# Patient Record
Sex: Female | Born: 1995 | Race: White | Hispanic: No | Marital: Single | Smoking: Never smoker
Health system: Southern US, Community
[De-identification: ages and names within clinical notes are randomized; demographics above are authoritative.]

## PROBLEM LIST (undated history)

## (undated) DIAGNOSIS — C9200 Acute myeloblastic leukemia, not having achieved remission: Secondary | ICD-10-CM

## (undated) DIAGNOSIS — J45909 Unspecified asthma, uncomplicated: Secondary | ICD-10-CM

## (undated) HISTORY — PX: BONE MARROW TRANSPLANT: SHX200

---

## 2018-06-19 ENCOUNTER — Emergency Department: Payer: Commercial Managed Care - PPO

## 2018-06-19 ENCOUNTER — Other Ambulatory Visit: Payer: Self-pay

## 2018-06-19 ENCOUNTER — Emergency Department
Admission: EM | Admit: 2018-06-19 | Discharge: 2018-06-19 | Disposition: A | Discharge status: Home / Self Care | Payer: Commercial Managed Care - PPO | Attending: Emergency Medicine | Admitting: Emergency Medicine

## 2018-06-19 ENCOUNTER — Encounter: Payer: Self-pay | Admitting: Emergency Medicine

## 2018-06-19 DIAGNOSIS — Y9289 Other specified places as the place of occurrence of the external cause: Secondary | ICD-10-CM | POA: Insufficient documentation

## 2018-06-19 DIAGNOSIS — W010XXA Fall on same level from slipping, tripping and stumbling without subsequent striking against object, initial encounter: Secondary | ICD-10-CM | POA: Diagnosis not present

## 2018-06-19 DIAGNOSIS — Y9389 Activity, other specified: Secondary | ICD-10-CM | POA: Insufficient documentation

## 2018-06-19 DIAGNOSIS — S99912A Unspecified injury of left ankle, initial encounter: Secondary | ICD-10-CM | POA: Diagnosis present

## 2018-06-19 DIAGNOSIS — Y998 Other external cause status: Secondary | ICD-10-CM | POA: Diagnosis not present

## 2018-06-19 DIAGNOSIS — S93402A Sprain of unspecified ligament of left ankle, initial encounter: Secondary | ICD-10-CM | POA: Diagnosis not present

## 2018-06-19 HISTORY — DX: Acute myeloblastic leukemia, not having achieved remission: C92.00

## 2018-06-19 MED ORDER — IBUPROFEN 600 MG PO TABS: 600.0000 mg | ORAL_TABLET | Freq: Three times a day (TID) | ORAL | 0 refills | Status: AC | PRN

## 2018-06-19 MED ORDER — IBUPROFEN 600 MG PO TABS
600.0000 mg | ORAL_TABLET | Freq: Once | ORAL | Status: AC
  Administered 2018-06-19: 600 mg via ORAL
  Filled 2018-06-19: qty 1

## 2018-06-19 MED ORDER — TRAMADOL HCL 50 MG PO TABS
50.0000 mg | ORAL_TABLET | Freq: Once | ORAL | Status: AC
  Administered 2018-06-19: 50 mg via ORAL
  Filled 2018-06-19: qty 1

## 2018-06-19 MED ORDER — TRAMADOL HCL 50 MG PO TABS: 50.0000 mg | ORAL_TABLET | Freq: Two times a day (BID) | ORAL | 0 refills | Status: DC | PRN

## 2018-06-19 NOTE — ED Provider Notes (Signed)
[facility] Emergency Department Provider Note   ____________________________________________   First MD Initiated Contact with Patient 06/19/18 [phone]     (approximate)  I have reviewed the triage vital signs and the nursing notes.   HISTORY  Chief Complaint Ankle Pain    HPI Patricia Cardenas is a 23 y.o. female patient presents with left ankle pain secondary to a fall last night.  Patient is a pain and edema is preventing her from weightbearing.  Patient rates pain as 7/10.  Patient described pain is "achy".  No palliative measure for complaint.    Past Medical History:  Diagnosis Date  . 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   Past Surgical History:  Procedure Laterality Date  . BONE MARROW TRANSPLANT      Prior to Admission medications   Medication Sig Start Date End Date Taking? Authorizing Provider  cholecalciferol (VITAMIN D3) 25 MCG (1000 UT) tablet Take 1,000 Units by mouth 2 (two) times daily.   Yes [provider]  montelukast (SINGULAIR) 10 MG tablet Take 10 mg by mouth at bedtime.   Yes [provider]  ibuprofen (ADVIL,MOTRIN) 600 MG tablet Take 1 tablet (600 mg total) by mouth every 8 (eight) hours as needed. 06/19/18   Sable Feil, PA-C  traMADol (ULTRAM) 50 MG tablet Take 1 tablet (50 mg total) by mouth every 12 (twelve) hours as needed. 06/19/18   Sable Feil, PA-C    Allergies Methotrexate  No family history on file.  Social History Social History   Tobacco Use  . Smoking status: Never Smoker  . Smokeless tobacco: Never Used  Substance Use Topics  . Alcohol use: Yes    Frequency: Never    Comment: occasional  . Drug use: Never    Review of Systems Constitutional: No fever/chills Eyes: No visual changes. ENT: No sore throat. Cardiovascular: Denies chest pain. Respiratory: Denies shortness of breath. Gastrointestinal: No abdominal  pain.  No nausea, no vomiting.  No diarrhea.  No constipation. Genitourinary: Negative for dysuria. Musculoskeletal: Left lateral ankle pain.   Skin: Negative for rash. Neurological: Negative for headaches, focal weakness or numbness. Allergic/Immunilogical: Methotrexate ____________________________________________   PHYSICAL EXAM:  VITAL SIGNS: ED Triage Vitals [06/19/18 0736]  Enc Vitals Group     BP 115/78     Pulse Rate (!) 108     Resp 16     Temp 98.2 F (36.8 C)     Temp Source Oral     SpO2 96 %     Weight 130 lb (59 kg)     Height 5\' 3"  (1.6 m)     Head Circumference      Peak Flow      Pain Score 7     Pain Loc      Pain Edu?      Excl. in [place]?     Constitutional: Alert and oriented. Well appearing and in no acute distress. Cardiovascular: Normal rate, regular rhythm. Grossly normal heart sounds.  Good peripheral circulation. Respiratory: Normal respiratory effort.  No retractions. Lungs CTAB. Musculoskeletal: No obvious deformity to left ankle.  Moderate lateral edema.   Neurologic:  Normal speech and language. No gross focal neurologic deficits are appreciated. No gait instability. Skin:  Skin is warm, dry and intact. No rash noted. Psychiatric: Mood and affect are normal. Speech and behavior are normal.  ____________________________________________   LABS (all labs ordered  are listed, but only abnormal results are displayed)  Labs Reviewed - No data to display ____________________________________________  EKG   ____________________________________________  RADIOLOGY  ED MD interpretation:    Official radiology report(s): No results found.  ____________________________________________   PROCEDURES  Procedure(s) performed: None  .Splint Application Date/Time: 05/20/1094 8:16 AM Performed by: Brayton Caves, NT Authorized by: Sable Feil, PA-C   Consent:    Consent obtained:  Verbal   Consent given by:  Patient   Risks discussed:   Numbness, pain and swelling Pre-procedure details:    Sensation:  Normal Procedure details:    Laterality:  Left   Location:  Ankle   Ankle:  L ankle   Strapping: no     Splint type:  Short leg   Supplies:  Ortho-Glass Post-procedure details:    Pain:  Unchanged   Sensation:  Normal   Patient tolerance of procedure:  Tolerated well, no immediate complications    Critical Care performed: No  ____________________________________________   INITIAL IMPRESSION / ASSESSMENT AND PLAN / ED COURSE  As part of my medical decision making, I reviewed the following data within the electronic MEDICAL RECORD NUMBER     Left lateral ankle pain and edema secondary to sprain.  Patient placed in a OCL splint and given crutches to support ambulation.  Follow discharge care instructions.  Patient given a school note.  Patient vies take medication as directed.  Patient advised follow-up PCP if no improvement in 3 to 5 days.      ____________________________________________   FINAL CLINICAL IMPRESSION(S) / ED DIAGNOSES  Final diagnoses:  Sprain of left ankle, unspecified ligament, initial encounter     ED Discharge Orders         Ordered    traMADol (ULTRAM) 50 MG tablet  Every 12 hours PRN     06/19/18 0815    ibuprofen (ADVIL,MOTRIN) 600 MG tablet  Every 8 hours PRN     06/19/18 0815           Note:  This document was prepared using Dragon voice recognition software and may include unintentional dictation errors.    Sable Feil, PA-C 06/19/18 0454    Earleen Newport, MD 06/19/18 [phone]

## 2018-06-19 NOTE — ED Triage Notes (Signed)
Patient reports falling last night and hurting left ankle. Ankle swollen. Patient unable to bear weight.

## 2018-06-19 NOTE — Discharge Instructions (Signed)
Wear splint and ambulate with crutches foot 3 to 5 days as needed.

## 2019-02-06 ENCOUNTER — Other Ambulatory Visit: Payer: Self-pay

## 2019-02-06 DIAGNOSIS — Z20822 Contact with and (suspected) exposure to covid-19: Secondary | ICD-10-CM

## 2019-02-07 LAB — NOVEL CORONAVIRUS, NAA: SARS-CoV-2, NAA: NOT DETECTED

## 2019-02-21 ENCOUNTER — Ambulatory Visit: Payer: Commercial Managed Care - PPO | Admitting: Cardiology

## 2019-06-05 ENCOUNTER — Other Ambulatory Visit: Payer: Self-pay

## 2019-06-05 ENCOUNTER — Emergency Department: Payer: Commercial Managed Care - PPO

## 2019-06-05 ENCOUNTER — Emergency Department
Admission: EM | Admit: 2019-06-05 | Discharge: 2019-06-05 | Disposition: A | Discharge status: Home / Self Care | Payer: Commercial Managed Care - PPO | Attending: Emergency Medicine | Admitting: Emergency Medicine

## 2019-06-05 DIAGNOSIS — Y999 Unspecified external cause status: Secondary | ICD-10-CM | POA: Insufficient documentation

## 2019-06-05 DIAGNOSIS — Z79899 Other long term (current) drug therapy: Secondary | ICD-10-CM | POA: Insufficient documentation

## 2019-06-05 DIAGNOSIS — S63502A Unspecified sprain of left wrist, initial encounter: Secondary | ICD-10-CM

## 2019-06-05 DIAGNOSIS — S6992XA Unspecified injury of left wrist, hand and finger(s), initial encounter: Secondary | ICD-10-CM | POA: Diagnosis present

## 2019-06-05 DIAGNOSIS — Y9323 Activity, snow (alpine) (downhill) skiing, snow boarding, sledding, tobogganing and snow tubing: Secondary | ICD-10-CM | POA: Diagnosis not present

## 2019-06-05 DIAGNOSIS — Y92828 Other wilderness area as the place of occurrence of the external cause: Secondary | ICD-10-CM | POA: Diagnosis not present

## 2019-06-05 MED ORDER — OXYCODONE-ACETAMINOPHEN 5-325 MG PO TABS
1.0000 | ORAL_TABLET | Freq: Once | ORAL | Status: AC
  Administered 2019-06-05: 1 via ORAL
  Filled 2019-06-05: qty 1

## 2019-06-05 MED ORDER — MELOXICAM 7.5 MG PO TABS
15.0000 mg | ORAL_TABLET | Freq: Once | ORAL | Status: AC
  Administered 2019-06-05: 21:00:00 15 mg via ORAL
  Filled 2019-06-05: qty 2

## 2019-06-05 MED ORDER — MELOXICAM 15 MG PO TABS: 15.0000 mg | ORAL_TABLET | Freq: Every day | ORAL | 0 refills | Status: AC

## 2019-06-05 NOTE — ED Provider Notes (Signed)
[facility] Emergency Department Provider Note  ____________________________________________  Time seen: Approximately 8:58 PM  I have reviewed the triage vital signs and the nursing notes.   HISTORY  Chief Complaint Wrist Pain    HPI Patricia Cardenas is a 24 y.o. female who presents the emergency department complaining of left wrist pain.  Patient reports that she was snowboarding today, caught the edge of a stone or falling onto an outstretched hand.  Patient is having pain to the wrist region of the left wrist.  No other injury or complaint.  She did not hit her head or lose consciousness.  Patient has her arm in a sling at this time for symptom relief.  No numbness or tingling of the hand.  No history of previous injury to the wrist.         Past Medical History:  Diagnosis Date  . 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   Past Surgical History:  Procedure Laterality Date  . BONE MARROW TRANSPLANT      Prior to Admission medications   Medication Sig Start Date End Date Taking? Authorizing Provider  cholecalciferol (VITAMIN D3) 25 MCG (1000 UT) tablet Take 1,000 Units by mouth 2 (two) times daily.    [provider]  ibuprofen (ADVIL,MOTRIN) 600 MG tablet Take 1 tablet (600 mg total) by mouth every 8 (eight) hours as needed. 06/19/18   Sable Feil, PA-C  meloxicam (MOBIC) 15 MG tablet Take 1 tablet (15 mg total) by mouth daily. 06/05/19   Jacquita Mulhearn, Charline Bills, PA-C  montelukast (SINGULAIR) 10 MG tablet Take 10 mg by mouth at bedtime.    [provider]  traMADol (ULTRAM) 50 MG tablet Take 1 tablet (50 mg total) by mouth every 12 (twelve) hours as needed. 06/19/18   Sable Feil, PA-C    Allergies Methotrexate  No family history on file.  Social History Social History   Tobacco Use  . Smoking status: Never Smoker  . Smokeless tobacco: Never Used  Substance Use Topics   . Alcohol use: Yes    Comment: occasional  . Drug use: Never     Review of Systems  Constitutional: No fever/chills Eyes: No visual changes. No discharge ENT: No upper respiratory complaints. Cardiovascular: no chest pain. Respiratory: no cough. No SOB. Gastrointestinal: No abdominal pain.  No nausea, no vomiting.  Musculoskeletal: Positive for left wrist injury Skin: Negative for rash, abrasions, lacerations, ecchymosis. Neurological: Negative for headaches, focal weakness or numbness. 10-point ROS otherwise negative.  ____________________________________________   PHYSICAL EXAM:  VITAL SIGNS: ED Triage Vitals  Enc Vitals Group     BP 06/05/19 1916 140/85     Pulse Rate 06/05/19 1916 81     Resp 06/05/19 1916 18     Temp 06/05/19 1916 98.5 F (36.9 C)     Temp src --      SpO2 06/05/19 1916 97 %     Weight 06/05/19 1917 137 lb (62.1 kg)     Height 06/05/19 1917 5\' 3"  (1.6 m)     Head Circumference --      Peak Flow --      Pain Score 06/05/19 1916 6     Pain Loc --      Pain Edu? --      Excl. in [place]? --      Constitutional: Alert and oriented. Well appearing and in no acute distress. Eyes: Conjunctivae  are normal. PERRL. EOMI. Head: Atraumatic. ENT:      Ears:       Nose: No congestion/rhinnorhea.      Mouth/Throat: Mucous membranes are moist.  Neck: No stridor.    Cardiovascular: Normal rate, regular rhythm. Normal S1 and S2.  Good peripheral circulation. Respiratory: Normal respiratory effort without tachypnea or retractions. Lungs CTAB. Good air entry to the bases with no decreased or absent breath sounds. Musculoskeletal: Full range of motion to all extremities. No gross deformities appreciated.  Visualization of the left wrist reveals mild edema when compared with right.  No obvious deformity.  No abrasions, lacerations, significant ecchymosis.  Patient has limited range of motion to the wrist but is able to move all 5 digits appropriately.  Patient is  exquisitely tender to palpation over both the distal radius and the ulna.  No palpable abnormality.  Radial pulse intact.  Sensation intact all 5 digits.  Capillary refill less than 2 seconds all digits. Neurologic:  Normal speech and language. No gross focal neurologic deficits are appreciated.  Skin:  Skin is warm, dry and intact. No rash noted. Psychiatric: Mood and affect are normal. Speech and behavior are normal. Patient exhibits appropriate insight and judgement.   ____________________________________________   LABS (all labs ordered are listed, but only abnormal results are displayed)  Labs Reviewed - No data to display ____________________________________________  EKG   ____________________________________________  RADIOLOGY I personally viewed and evaluated these images as part of my medical decision making, as well as reviewing the written report by the radiologist.  DG Wrist Complete Left  Result Date: 06/05/2019 CLINICAL DATA:  Left wrist pain. EXAM: LEFT WRIST - COMPLETE 3+ VIEW COMPARISON:  None. FINDINGS: There is no evidence of fracture or dislocation. There is no evidence of arthropathy or other focal bone abnormality. Soft tissues are unremarkable. IMPRESSION: No definite acute displaced fracture or dislocation. If an occult fracture is suspected, follow-up radiographs are recommended in 10-14 days. Electronically Signed   By: Constance Holster M.D.   On: 06/05/2019 19:39    ____________________________________________    PROCEDURES  Procedure(s) performed:    Procedures    Medications  meloxicam (MOBIC) tablet 15 mg (has no administration in time range)  oxyCODONE-acetaminophen (PERCOCET/ROXICET) 5-325 MG per tablet 1 tablet (has no administration in time range)     ____________________________________________   INITIAL IMPRESSION / ASSESSMENT AND PLAN / ED COURSE  Pertinent labs & imaging results that were available during my care of the  patient were reviewed by me and considered in my medical decision making (see chart for details).  Review of the [place] CSRS was performed in accordance of the [place] prior to dispensing any controlled drugs.           Patient's diagnosis is consistent with left wrist pain.  Patient presents emergency department complaining of left wrist pain after snowboard accident.  Imaging reveals no evidence of fracture.  No evidence of ligament rupture.  Patient will be placed in a Velcro wrist splint.. Patient will be discharged home with prescriptions for meloxicam. Patient is to follow up with orthopedics as needed or otherwise directed. Patient is given ED precautions to return to the ED for any worsening or new symptoms.     ____________________________________________  FINAL CLINICAL IMPRESSION(S) / ED DIAGNOSES  Final diagnoses:  Sprain of left wrist, initial encounter      NEW MEDICATIONS STARTED DURING THIS VISIT:  ED Discharge Orders         Ordered  meloxicam (MOBIC) 15 MG tablet  Daily     06/05/19 2102              This chart was dictated using voice recognition software/Dragon. Despite best efforts to proofread, errors can occur which can change the meaning. Any change was purely unintentional.    Darletta Moll, PA-C 06/05/19 2105    Harvest Dark, MD 06/05/19 2111

## 2019-06-05 NOTE — ED Triage Notes (Signed)
[place]. Patient c/o left wrist pain.

## 2019-07-24 ENCOUNTER — Ambulatory Visit: Payer: Commercial Managed Care - PPO

## 2019-08-04 ENCOUNTER — Ambulatory Visit: Payer: Commercial Managed Care - PPO | Attending: Internal Medicine

## 2019-08-04 ENCOUNTER — Other Ambulatory Visit: Payer: Self-pay

## 2019-08-04 DIAGNOSIS — Z23 Encounter for immunization: Secondary | ICD-10-CM

## 2019-08-04 NOTE — Progress Notes (Signed)
   Covid-19 Vaccination Clinic  Name:  Patricia Cardenas    MRN: [id number] DOB: 08-07-1995  08/04/2019  [name] was observed post Covid-19 immunization for 15 minutes without incident. She was provided with Vaccine Information Sheet and instruction to access the V-Safe system.   [name] was instructed to call 911 with any severe reactions post vaccine: Marland Kitchen Difficulty breathing  . Swelling of face and throat  . A fast heartbeat  . A bad rash all over body  . Dizziness and weakness   Immunizations Administered    Name Date Dose VIS Date Route   Pfizer COVID-19 Vaccine 08/04/2019 11:57 AM 0.3 mL 04/25/2019 Intramuscular   Manufacturer: [employer]   Lot: [id number]   [place]: [id number]

## 2019-09-02 ENCOUNTER — Ambulatory Visit: Payer: Commercial Managed Care - PPO | Attending: Internal Medicine

## 2019-09-02 DIAGNOSIS — Z23 Encounter for immunization: Secondary | ICD-10-CM

## 2019-09-02 NOTE — Progress Notes (Signed)
   Covid-19 Vaccination Clinic  Name:  Magdlene Prak    MRN: [id number] DOB: [birth year]  09/02/2019  Ms. Praska was observed post Covid-19 immunization for 15 minutes without incident. She was provided with Vaccine Information Sheet and instruction to access the V-Safe system.   Ms. Manganiello was instructed to call 911 with any severe reactions post vaccine: Marland Kitchen Difficulty breathing  . Swelling of face and throat  . A fast heartbeat  . A bad rash all over body  . Dizziness and weakness   Immunizations Administered    Name Date Dose VIS Date Route   Pfizer COVID-19 Vaccine 09/02/2019 11:38 AM 0.3 mL 07/09/2018 Intramuscular   Manufacturer: [employer], [employer]   Lot: [id number]   [place]: [id number]

## 2020-01-05 ENCOUNTER — Emergency Department
Admission: EM | Admit: 2020-01-05 | Discharge: 2020-01-05 | Discharge status: Against Medical Advice | Payer: Commercial Managed Care - PPO

## 2020-01-05 NOTE — ED Notes (Signed)
No answer when called several times from lobby 

## 2020-01-05 NOTE — ED Notes (Signed)
No answer when called several times from lobby; no answer when cell phone called

## 2020-01-05 NOTE — ED Notes (Signed)
Out in her car

## 2020-06-21 ENCOUNTER — Emergency Department: Payer: Commercial Managed Care - PPO

## 2020-06-21 ENCOUNTER — Other Ambulatory Visit: Payer: Self-pay

## 2020-06-21 ENCOUNTER — Emergency Department
Admission: EM | Admit: 2020-06-21 | Discharge: 2020-06-21 | Disposition: A | Discharge status: Home / Self Care | Payer: Commercial Managed Care - PPO | Attending: Emergency Medicine | Admitting: Emergency Medicine

## 2020-06-21 DIAGNOSIS — M25422 Effusion, left elbow: Secondary | ICD-10-CM | POA: Diagnosis not present

## 2020-06-21 DIAGNOSIS — W010XXA Fall on same level from slipping, tripping and stumbling without subsequent striking against object, initial encounter: Secondary | ICD-10-CM | POA: Insufficient documentation

## 2020-06-21 DIAGNOSIS — M25522 Pain in left elbow: Secondary | ICD-10-CM | POA: Diagnosis present

## 2020-06-21 MED ORDER — NAPROXEN 500 MG PO TABS: 500.0000 mg | ORAL_TABLET | Freq: Two times a day (BID) | ORAL | 0 refills | Status: AC

## 2020-06-21 MED ORDER — TRAMADOL HCL 50 MG PO TABS: 50.0000 mg | ORAL_TABLET | Freq: Four times a day (QID) | ORAL | 0 refills | Status: AC | PRN

## 2020-06-21 NOTE — ED Triage Notes (Signed)
Pt comes with c/o left arm injury falling a slip and fall this am. Pt states pain to left arm and shoulder. Pt states some back and butt pain as well. Pt states pain at elbow as well  Pt able to move fingers and pain to move arm.

## 2020-06-21 NOTE — ED Notes (Signed)
See triage note  Presents s/p slip and fall    Landed on left elbow and lower back

## 2020-06-21 NOTE — ED Provider Notes (Signed)
[facility] Emergency Department Provider Note ____________________________________________  Time seen: Approximately 2:12 PM  I have reviewed the triage vital signs and the nursing notes.   HISTORY  Chief Complaint Arm Injury    HPI Patricia Cardenas is a 25 y.o. female who presents to the emergency department for evaluation and treatment of left elbow pain after a mechanical, non-syncopal fall this morning. As she was walking out to go to class, her foot slipped and she fell down 5 brick stairs. She caught herself with her elbow and slid down the rest of the way on her back.   Past Medical History:  Diagnosis Date  . 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   Past Surgical History:  Procedure Laterality Date  . BONE MARROW TRANSPLANT      Prior to Admission medications   Medication Sig Start Date End Date Taking? Authorizing Provider  naproxen (NAPROSYN) 500 MG tablet Take 1 tablet (500 mg total) by mouth 2 (two) times daily with a meal. 06/21/20  Yes Tina Temme B, FNP  traMADol (ULTRAM) 50 MG tablet Take 1 tablet (50 mg total) by mouth every 6 (six) hours as needed. 06/21/20  Yes Yadira Hada B, FNP  cholecalciferol (VITAMIN D3) 25 MCG (1000 UT) tablet Take 1,000 Units by mouth 2 (two) times daily.    [provider]  ibuprofen (ADVIL,MOTRIN) 600 MG tablet Take 1 tablet (600 mg total) by mouth every 8 (eight) hours as needed. 06/19/18   Sable Feil, PA-C  meloxicam (MOBIC) 15 MG tablet Take 1 tablet (15 mg total) by mouth daily. 06/05/19   Cuthriell, Charline Bills, PA-C  montelukast (SINGULAIR) 10 MG tablet Take 10 mg by mouth at bedtime.    [provider]    Allergies Methotrexate  No family history on file.  Social History Social History   Tobacco Use  . Smoking status: Never Smoker  . Smokeless tobacco: Never Used  Vaping Use  . Vaping Use: Never used  Substance Use  Topics  . Alcohol use: Yes    Comment: occasional  . Drug use: Never    Review of Systems Constitutional: Negative for fever. Cardiovascular: Negative for chest pain. Respiratory: Negative for shortness of breath. Musculoskeletal: Positive for left upper extremity pain Skin: Negative for open wounds or lesions Neurological: Negative for decrease in sensation  ____________________________________________   PHYSICAL EXAM:  VITAL SIGNS: ED Triage Vitals  Enc Vitals Group     BP 06/21/20 1204 (!) 138/99     Pulse Rate 06/21/20 1204 81     Resp 06/21/20 1204 18     Temp 06/21/20 1204 98.2 F (36.8 C)     Temp Source 06/21/20 1204 Oral     SpO2 06/21/20 1206 100 %     Weight 06/21/20 1321 136 lb 14.5 oz (62.1 kg)     Height 06/21/20 1321 5\' 3"  (1.6 m)     Head Circumference --      Peak Flow --      Pain Score 06/21/20 1204 10     Pain Loc --      Pain Edu? --      Excl. in [place]? --     Constitutional: Alert and oriented. Well appearing and in no acute distress. Eyes: Conjunctivae are clear without discharge or drainage Head: Atraumatic Neck: Supple.  No focal midline tenderness Respiratory: No cough. Respirations are even and unlabored. Musculoskeletal: Unable  to fully extend left elbow.  Full range of motion of the wrist.  No tenderness over the upper part of the left forearm or shoulder. Neurologic: Awake, alert, oriented Skin: No open wound Psychiatric: Affect and behavior are appropriate.  ____________________________________________   LABS (all labs ordered are listed, but only abnormal results are displayed)  Labs Reviewed - No data to display ____________________________________________  RADIOLOGY  Elbow joint effusion without definite acute fracture but suspicious for occult fracture.  I, Sherrie George, personally viewed and evaluated these images (plain radiographs) as part of my medical decision making, as well as reviewing the written report by the  radiologist.  No results found. ____________________________________________   PROCEDURES  .Ortho Injury Treatment  Date/Time: 06/22/2020 4:34 PM Performed by: Victorino Dike, FNP Authorized by: Victorino Dike, FNP   Consent:    Consent obtained:  Verbal   Consent given by:  Patient   Risks discussed:  Restricted joint movement and stiffnessInjury location: elbow Location details: left elbow Injury type: soft tissue Pre-procedure neurovascular assessment: neurovascularly intact Pre-procedure distal perfusion: normal Pre-procedure neurological function: normal Pre-procedure range of motion: reduced Immobilization: splint Splint type: long arm Splint Applied by: ED Nurse Supplies used: cotton padding,  elastic bandage and Ortho-Glass Post-procedure neurovascular assessment: post-procedure neurovascularly intact Post-procedure distal perfusion: normal Post-procedure neurological function: normal Post-procedure range of motion: unchanged     ____________________________________________   INITIAL IMPRESSION / [place] / ED COURSE  Patricia Cardenas is a 25 y.o. who presents to the emergency department for treatment and evaluation of left arm pain after falling on the ice. See HPI for details.  X-ray shows a joint effusion and likely occult fracture. Fracture would be most consistent with radial neck fracture or similar injury. She was placed in a long arm posterior OCL and is to follow up with orthopedics for repeat imaging.  Medications - No data to display  Pertinent labs & imaging results that were available during my care of the patient were reviewed by me and considered in my medical decision making (see chart for details).   _________________________________________   FINAL CLINICAL IMPRESSION(S) / ED DIAGNOSES  Final diagnoses:  Joint effusion of elbow, left    ED Discharge Orders         Ordered    traMADol (ULTRAM) 50 MG tablet  Every 6 hours  PRN        06/21/20 1550    naproxen (NAPROSYN) 500 MG tablet  2 times daily with meals        06/21/20 1550           If controlled substance prescribed during this visit, 12 month history viewed on the [place] prior to issuing an initial prescription for Schedule II or III opiod.   Victorino Dike, FNP 06/22/20 1646    Vladimir Crofts, MD 06/22/20 [place]

## 2020-06-21 NOTE — Discharge Instructions (Signed)
Leave the splint in place until you can follow up with orthopedics.  Return to the ER for symptoms that change or worsen or for new concerns if unable to see orthopedics.

## 2020-07-18 ENCOUNTER — Emergency Department
Admission: EM | Admit: 2020-07-18 | Discharge: 2020-07-18 | Disposition: A | Discharge status: Home / Self Care | Payer: Commercial Managed Care - PPO | Attending: Emergency Medicine | Admitting: Emergency Medicine

## 2020-07-18 ENCOUNTER — Other Ambulatory Visit: Payer: Self-pay

## 2020-07-18 ENCOUNTER — Encounter: Payer: Self-pay | Admitting: Emergency Medicine

## 2020-07-18 ENCOUNTER — Emergency Department: Payer: Commercial Managed Care - PPO

## 2020-07-18 DIAGNOSIS — Y9289 Other specified places as the place of occurrence of the external cause: Secondary | ICD-10-CM | POA: Diagnosis not present

## 2020-07-18 DIAGNOSIS — J45909 Unspecified asthma, uncomplicated: Secondary | ICD-10-CM | POA: Diagnosis not present

## 2020-07-18 DIAGNOSIS — W1809XA Striking against other object with subsequent fall, initial encounter: Secondary | ICD-10-CM | POA: Diagnosis not present

## 2020-07-18 DIAGNOSIS — S0990XA Unspecified injury of head, initial encounter: Secondary | ICD-10-CM | POA: Diagnosis present

## 2020-07-18 DIAGNOSIS — M542 Cervicalgia: Secondary | ICD-10-CM | POA: Diagnosis not present

## 2020-07-18 DIAGNOSIS — Y9389 Activity, other specified: Secondary | ICD-10-CM | POA: Diagnosis not present

## 2020-07-18 HISTORY — DX: Unspecified asthma, uncomplicated: J45.909

## 2020-07-18 MED ORDER — ONDANSETRON 4 MG PO TBDP: 4.0000 mg | ORAL_TABLET | Freq: Three times a day (TID) | ORAL | 0 refills | Status: AC | PRN

## 2020-07-18 NOTE — Discharge Instructions (Signed)
Follow up with your regular doctor if not improving 3 to 4 days Return to the ER if worsening Take the zofran as needed for nausea Tylenol or ibuprofen for pain as needed Apply ice to the area that hurts

## 2020-07-18 NOTE — ED Triage Notes (Signed)
Pt via POV from home. Pt states that she fell off of the stool and hit the back of head on the floor. Denies blood thinners. Denies LOC. Pt c/o L sided head pain that radiates to her neck. Pt's fall was around 9:00pm last night. Pt took Tylenol this AM around 9:00pm with no relief. Pt is A&Ox4 and NAD.

## 2020-07-18 NOTE — ED Provider Notes (Signed)
[facility] Emergency Department Provider Note  ____________________________________________   Event Date/Time   First MD Initiated Contact with Patient 07/18/20 1613     (approximate)  I have reviewed the triage vital signs and the nursing notes.   HISTORY  Chief Complaint Fall    HPI Patricia Cardenas is a 25 y.o. female presents emergency department complaining of headache and neck pain.  Patient states that she was standing on a bench cleaning last night when she fell and hit the right side of her head along with her ear.  No LOC at the time.  Patient's had a headache and nausea since she fell.  No other injuries reported.    Past Medical History:  Diagnosis Date  . 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 Asthma     There are no problems to display for this patient.   Past Surgical History:  Procedure Laterality Date  . BONE MARROW TRANSPLANT      Prior to Admission medications   Medication Sig Start Date End Date Taking? Authorizing Provider  ondansetron (ZOFRAN-ODT) 4 MG disintegrating tablet Take 1 tablet (4 mg total) by mouth every 8 (eight) hours as needed. 07/18/20  Yes Emmarose Klinke, Linden Dolin, PA-C  cholecalciferol (VITAMIN D3) 25 MCG (1000 UT) tablet Take 1,000 Units by mouth 2 (two) times daily.    [provider]  ibuprofen (ADVIL,MOTRIN) 600 MG tablet Take 1 tablet (600 mg total) by mouth every 8 (eight) hours as needed. 06/19/18   Sable Feil, PA-C  meloxicam (MOBIC) 15 MG tablet Take 1 tablet (15 mg total) by mouth daily. 06/05/19   Cuthriell, Charline Bills, PA-C  montelukast (SINGULAIR) 10 MG tablet Take 10 mg by mouth at bedtime.    [provider]  naproxen (NAPROSYN) 500 MG tablet Take 1 tablet (500 mg total) by mouth 2 (two) times daily with a meal. 06/21/20   Triplett, Cari B, FNP  traMADol (ULTRAM) 50 MG tablet Take 1 tablet (50 mg total) by mouth every 6 (six) hours as needed. 06/21/20   Sherrie George B, FNP     Allergies Methotrexate  History reviewed. No pertinent family history.  Social History Social History   Tobacco Use  . Smoking status: Never Smoker  . Smokeless tobacco: Never Used  Vaping Use  . Vaping Use: Never used  Substance Use Topics  . Alcohol use: Yes    Comment: occasional  . Drug use: Never    Review of Systems  Constitutional: No fever/chills Eyes: No visual changes. ENT: No sore throat. Respiratory: Denies cough Cardiovascular: Denies chest pain Gastrointestinal: Denies abdominal pain Genitourinary: Negative for dysuria. Musculoskeletal: Negative for back pain. Skin: Negative for rash. Psychiatric: no mood changes,     ____________________________________________   PHYSICAL EXAM:  VITAL SIGNS: ED Triage Vitals  Enc Vitals Group     BP 07/18/20 1525 (!) 152/80     Pulse Rate 07/18/20 1525 (!) 58     Resp 07/18/20 1525 16     Temp 07/18/20 1525 97.9 F (36.6 C)     Temp Source 07/18/20 1525 Oral     SpO2 07/18/20 1525 100 %     Weight 07/18/20 1530 140 lb (63.5 kg)     Height 07/18/20 1530 5\' 3"  (1.6 m)     Head Circumference --      Peak Flow --      Pain Score --      Pain Loc --  Pain Edu? --      Excl. in [place]? --     Constitutional: Alert and oriented. Well appearing and in no acute distress. Eyes: Conjunctivae are normal.  Head: Right side of skull is tender, tender along the mastoid area, right ear is tender, C-spine is tender at the upper aspect around C3, no battle signs noted Ear: TMs intact no blood is noted Nose: No congestion/rhinnorhea. Mouth/Throat: Mucous membranes are moist.   Neck:  supple no lymphadenopathy noted Cardiovascular: Normal rate, regular rhythm.  Respiratory: Normal respiratory effort.  No retractions, GU: deferred Musculoskeletal: FROM all extremities, warm and well perfused Neurologic:  Normal speech and language.  Cranial nerves II through XII grossly intact Skin:  Skin is warm, dry and intact.  No rash noted. Psychiatric: Mood and affect are normal. Speech and behavior are normal.  ____________________________________________   LABS (all labs ordered are listed, but only abnormal results are displayed)  Labs Reviewed - No data to display ____________________________________________   ____________________________________________  RADIOLOGY  CT of the head C-spine  ____________________________________________   PROCEDURES  Procedure(s) performed: No  Procedures    ____________________________________________   INITIAL IMPRESSION / ASSESSMENT AND PLAN / ED COURSE  Pertinent labs & imaging results that were available during my care of the patient were reviewed by me and considered in my medical decision making (see chart for details).   Patient is a 25 year old female presents emergency department with a head injury.  See HPI.  Physical exam shows patient appears stable.  CT of the head and C-spine ordered   CT of the head and C-spine reviewed by me confirmed by radiology to be negative.  I did explain the findings to the patient.  She is to take Tylenol or ibuprofen for pain.  Apply ice.  Limit her screen time as she may have a slight concussion.  Return the emergency department if worsening.  Follow-up with her regular doctor if not improving in 3 to 4 days.  States she understands.  She discharged stable condition.  Patricia Cardenas was evaluated in Emergency Department on 07/18/2020 for the symptoms described in the history of present illness. She was evaluated in the context of the global COVID-19 pandemic, which necessitated consideration that the patient might be at risk for infection with the SARS-CoV-2 virus that causes COVID-19. Institutional protocols and algorithms that pertain to the evaluation of patients at risk for COVID-19 are in a state of rapid change based on information released by regulatory bodies including the CDC and federal and state  organizations. These policies and algorithms were followed during the patient's care in the ED.    As part of my medical decision making, I reviewed the following data within the [place] notes reviewed and incorporated, Old chart reviewed, Radiograph reviewed , Notes from prior ED visits and [place] Controlled Substance Database  ____________________________________________   FINAL CLINICAL IMPRESSION(S) / ED DIAGNOSES  Final diagnoses:  Injury of head, initial encounter      NEW MEDICATIONS STARTED DURING THIS VISIT:  New Prescriptions   ONDANSETRON (ZOFRAN-ODT) 4 MG DISINTEGRATING TABLET    Take 1 tablet (4 mg total) by mouth every 8 (eight) hours as needed.     Note:  This document was prepared using Dragon voice recognition software and may include unintentional dictation errors.    Versie Starks, PA-C 07/18/20 1831    Lavonia Drafts, MD 07/18/20 Drema Halon

## 2020-09-08 ENCOUNTER — Encounter: Payer: Commercial Managed Care - PPO | Admitting: Certified Nurse Midwife

## 2020-10-08 ENCOUNTER — Telehealth: Payer: Self-pay | Admitting: *Deleted

## 2020-10-08 NOTE — Telephone Encounter (Signed)
Pt called stating she lost her COVID vaccination card and she needs this information  For travel; pt given web address for lost cards,  [email or web address]; she verbalized understanding and is able to access the site.

## 2021-03-30 IMAGING — CT CT HEAD W/O CM
3 series · 15 of 45 positions shown, 18 images · non-contrast
Comparison: None.

CLINICAL DATA: Altered mental status. Fall from stool striking
head.

EXAM:
CT HEAD WITHOUT CONTRAST
CT CERVICAL SPINE WITHOUT CONTRAST
TECHNIQUE: Multidetector CT imaging of the head and cervical spine was
performed following the standard protocol without intravenous
contrast. Multiplanar CT image reconstructions of the cervical spine
were also generated.

[Series 3: head wo · axial · 0.39mm/px · z∈[-119,-4]mm · 9 of 28 slices shown, 12 images]
[im 3/28  brain]
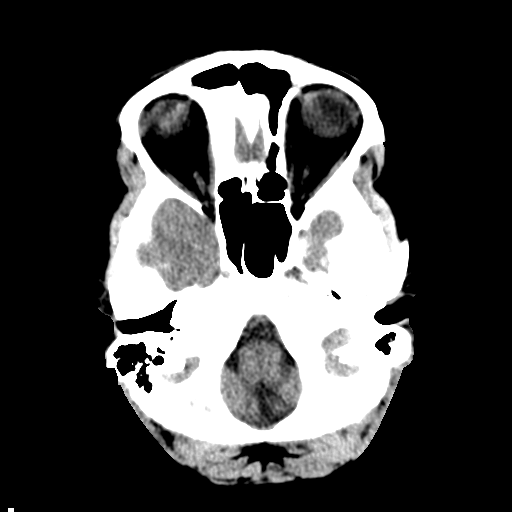
[im 3/28  bone]
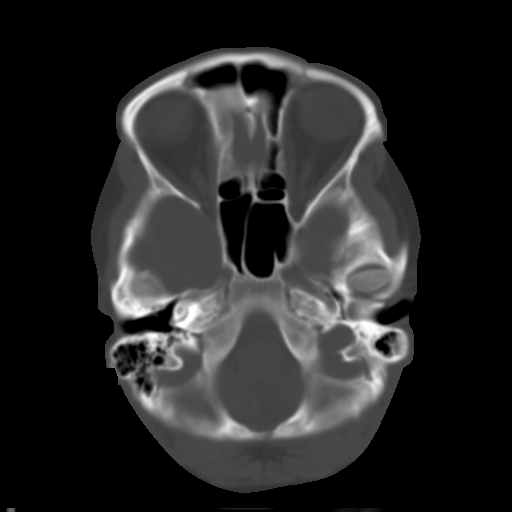
[im 6/28  brain]
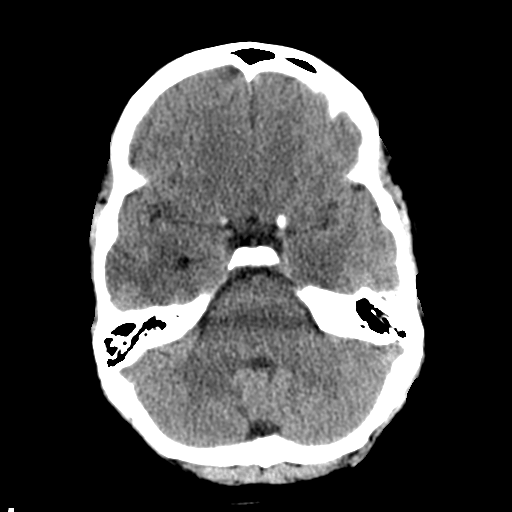
[im 9/28  brain]
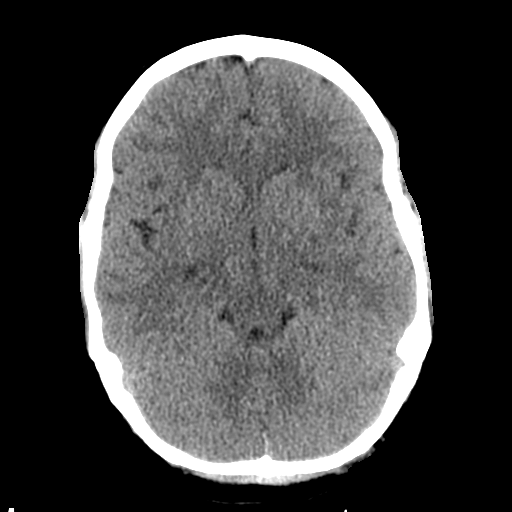
[im 12/28  brain]
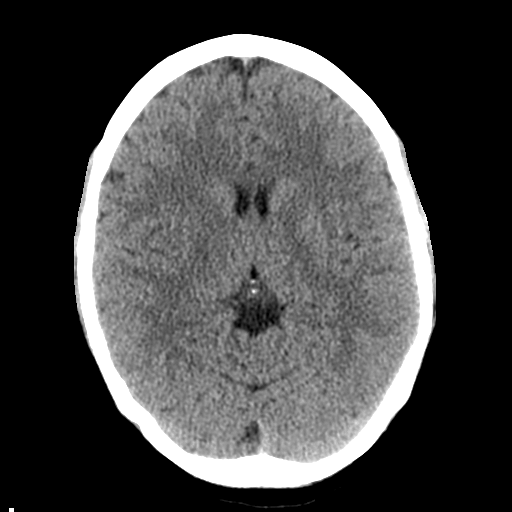
[im 15/28  brain]
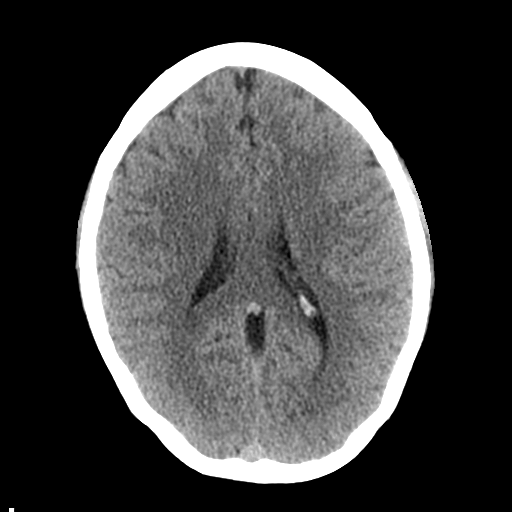
[im 15/28  bone]
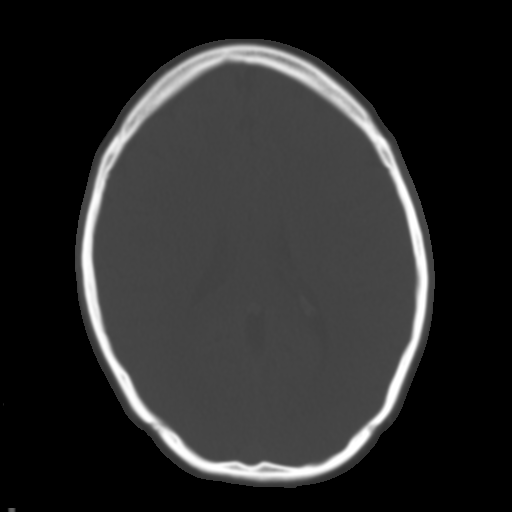
[im 17/28  brain]
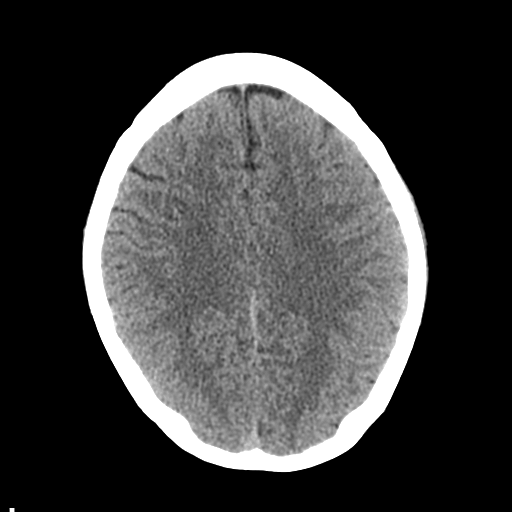
[im 20/28  brain]
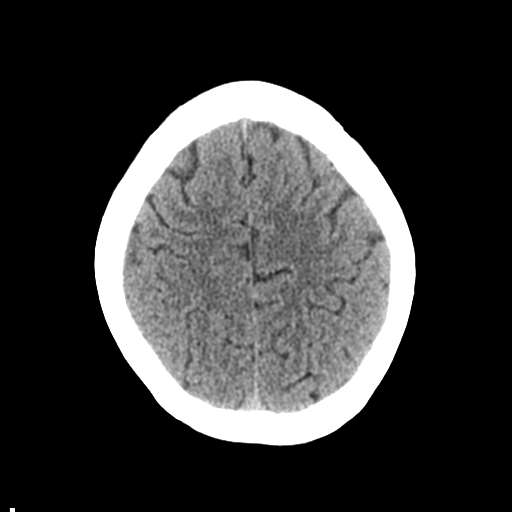
[im 23/28  brain]
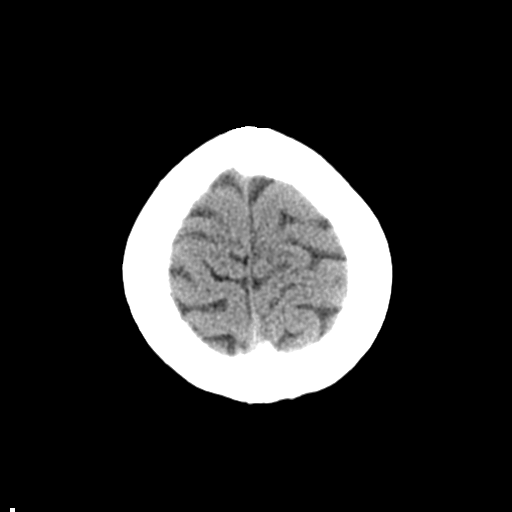
[im 26/28  brain]
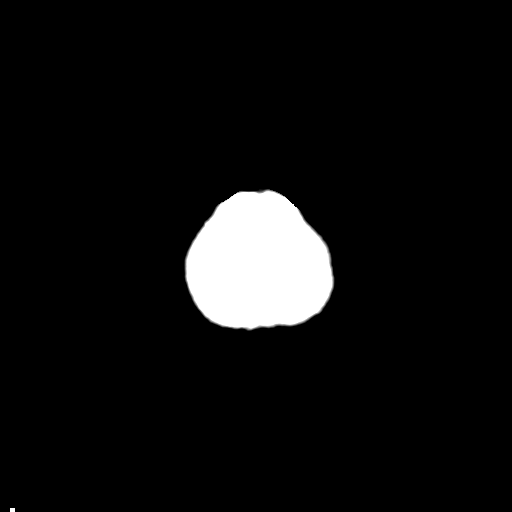
[im 26/28  bone]
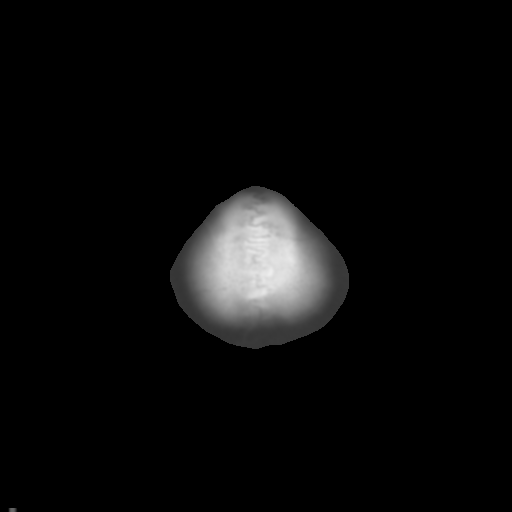

[Series 4: coronal soft tissue · coronal · 0.28mm/px · 3 of 63 slices shown]
[im 21/63  brain]
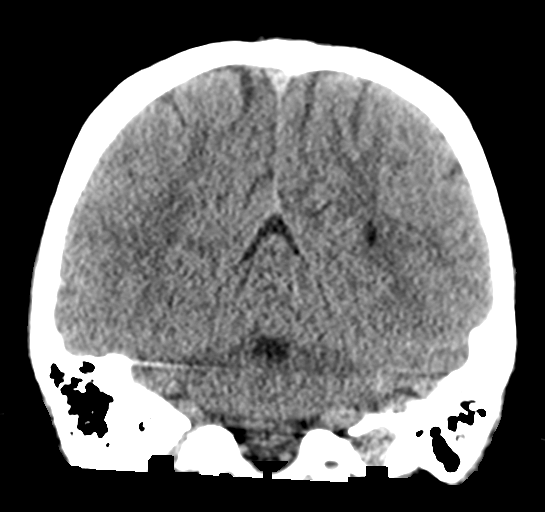
[im 28/63  brain]
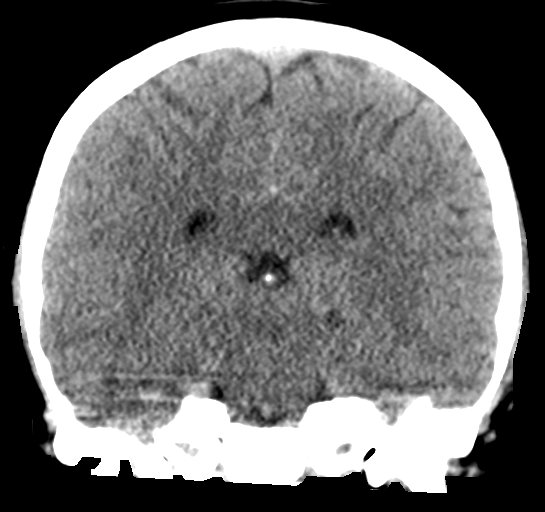
[im 35/63  brain]
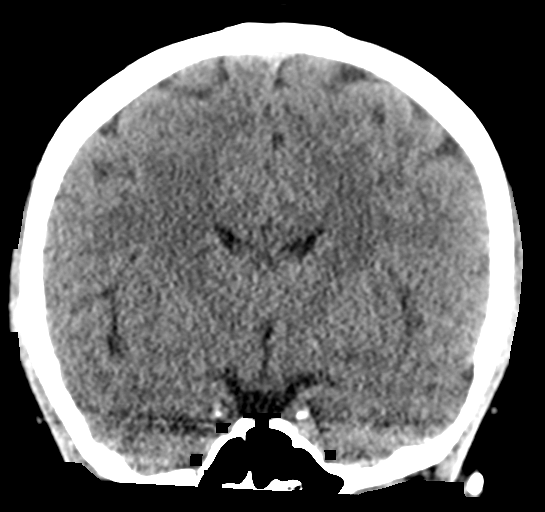

[Series 5: sagittal soft tissue · sagittal · 0.28mm/px · 3 of 51 slices shown]
[im 17/51  brain]
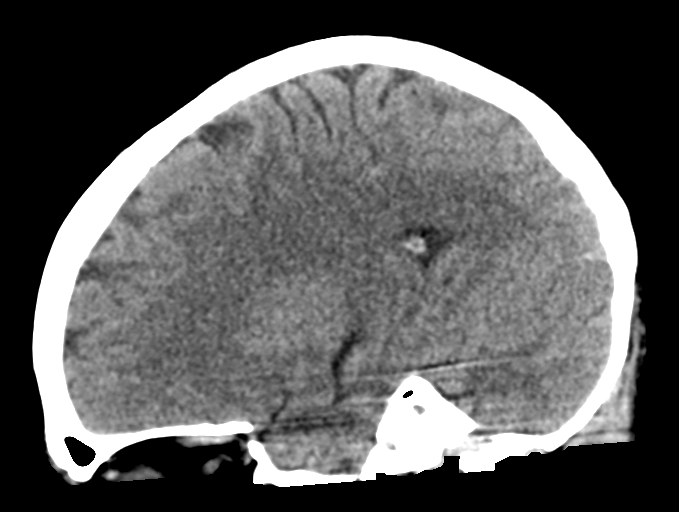
[im 26/51  brain]
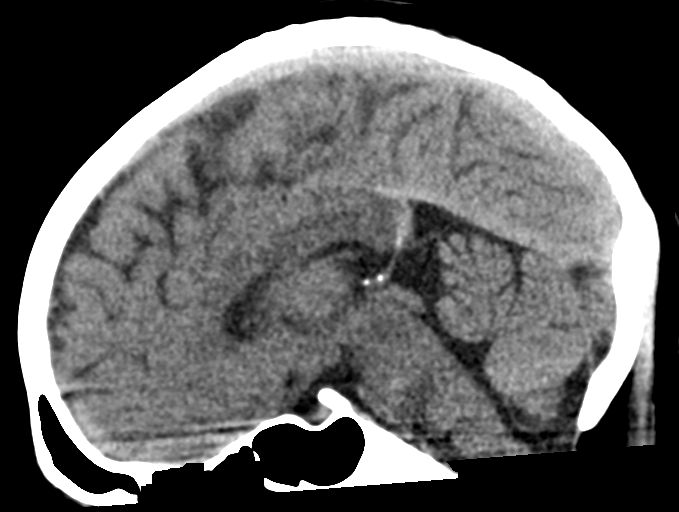
[im 34/51  brain]
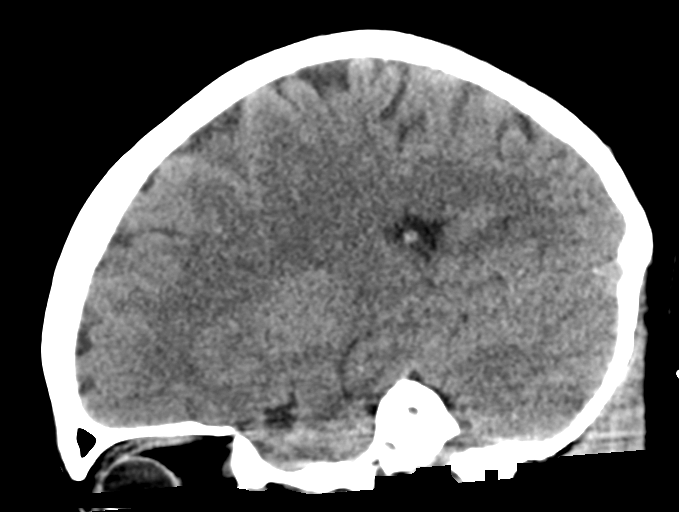

[15 of 45 positions shown; findings below may reference images not displayed]

FINDINGS: CT HEAD FINDINGS

Brain: No acute intracranial hemorrhage. No focal mass lesion. No CT
evidence of acute infarction. No midline shift or mass effect. No
hydrocephalus. Basilar cisterns are patent.

Vascular: No hyperdense vessel or unexpected calcification.

Skull: Normal. Negative for fracture or focal lesion.

Sinuses/Orbits: Paranasal sinuses and mastoid air cells are clear.
Orbits are clear.

Other: None.

CT CERVICAL SPINE FINDINGS

Alignment: Normal alignment of the cervical vertebral bodies.

Skull base and vertebrae: Normal craniocervical junction. Congenital
nonunion of the arch C 1 vertebral body. No loss of vertebral body
height or disc height. Normal facet articulation. No evidence of
fracture.

Soft tissues and spinal canal: No prevertebral soft tissue swelling.
No perispinal or epidural hematoma.

Disc levels:  Unremarkable

Upper chest: Clear

Other: None
IMPRESSION: 1. No intracranial trauma.
2. Normal head CT.
3. No cervical spine fracture.
